# Patient Record
Sex: Female | Born: 2000 | Race: Black or African American | Hispanic: No | Marital: Single | Smoking: Never smoker
Health system: Southern US, Community
[De-identification: ages and names within clinical notes are randomized; demographics above are authoritative.]

## PROBLEM LIST (undated history)

## (undated) DIAGNOSIS — F419 Anxiety disorder, unspecified: Secondary | ICD-10-CM

## (undated) DIAGNOSIS — T7840XA Allergy, unspecified, initial encounter: Secondary | ICD-10-CM

## (undated) DIAGNOSIS — F32A Depression, unspecified: Secondary | ICD-10-CM

## (undated) DIAGNOSIS — F1910 Other psychoactive substance abuse, uncomplicated: Secondary | ICD-10-CM

## (undated) HISTORY — DX: Depression, unspecified: F32.A

## (undated) HISTORY — DX: Other psychoactive substance abuse, uncomplicated: F19.10

## (undated) HISTORY — DX: Allergy, unspecified, initial encounter: T78.40XA

## (undated) HISTORY — DX: Anxiety disorder, unspecified: F41.9

---
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
## 2019-06-19 ENCOUNTER — Other Ambulatory Visit: Payer: Self-pay

## 2019-06-23 ENCOUNTER — Ambulatory Visit: Payer: Medicaid Other | Attending: Internal Medicine

## 2019-06-23 DIAGNOSIS — Z20822 Contact with and (suspected) exposure to covid-19: Secondary | ICD-10-CM

## 2019-06-24 LAB — NOVEL CORONAVIRUS, NAA: SARS-CoV-2, NAA: NOT DETECTED

## 2019-09-13 ENCOUNTER — Other Ambulatory Visit: Payer: Medicaid Other

## 2020-06-05 ENCOUNTER — Other Ambulatory Visit: Payer: Self-pay

## 2023-06-21 ENCOUNTER — Other Ambulatory Visit (HOSPITAL_COMMUNITY): Payer: Self-pay

## 2023-06-21 MED ORDER — NORETHINDRONE 0.35 MG PO TABS
1.0000 | ORAL_TABLET | Freq: Every day | ORAL | 84 refills | Status: DC
  Filled 2023-06-21: qty 28, 28d supply, fill #0
  Filled 2023-07-14: qty 28, 28d supply, fill #1

## 2023-07-28 ENCOUNTER — Other Ambulatory Visit (HOSPITAL_COMMUNITY): Payer: Self-pay

## 2023-07-28 MED ORDER — AMOXICILLIN-POT CLAVULANATE 875-125 MG PO TABS
875.0000 mg | ORAL_TABLET | Freq: Two times a day (BID) | ORAL | 0 refills | Status: DC
  Filled 2023-07-28: qty 14, 7d supply, fill #0

## 2023-07-28 MED ORDER — FLUTICASONE PROPIONATE 50 MCG/ACT NA SUSP
2.0000 | Freq: Two times a day (BID) | NASAL | 0 refills | Status: DC
  Filled 2023-07-28: qty 16, 15d supply, fill #0

## 2023-07-28 MED ORDER — CLARITIN-D 12 HOUR 5-120 MG PO TB12
1.0000 | ORAL_TABLET | Freq: Two times a day (BID) | ORAL | 0 refills | Status: DC
  Filled 2023-07-28: qty 10, 5d supply, fill #0

## 2023-12-13 ENCOUNTER — Other Ambulatory Visit (HOSPITAL_COMMUNITY): Payer: Self-pay

## 2023-12-13 MED ORDER — NORETHIN ACE-ETH ESTRAD-FE 1-20 MG-MCG PO TABS
1.0000 | ORAL_TABLET | Freq: Every day | ORAL | 3 refills | Status: AC
  Filled 2023-12-13: qty 28, 28d supply, fill #0
  Filled 2024-01-06: qty 28, 28d supply, fill #1
  Filled 2024-01-31: qty 28, 28d supply, fill #2
  Filled 2024-03-02: qty 28, 28d supply, fill #3
  Filled 2024-03-22 (×2): qty 28, 28d supply, fill #4
  Filled 2024-04-19: qty 28, 28d supply, fill #5
  Filled 2024-05-17: qty 28, 28d supply, fill #6
  Filled 2024-06-10: qty 28, 28d supply, fill #7
  Filled 2024-06-27 – 2024-07-01 (×2): qty 28, 28d supply, fill #8

## 2024-01-06 ENCOUNTER — Other Ambulatory Visit (HOSPITAL_COMMUNITY): Payer: Self-pay

## 2024-02-24 ENCOUNTER — Encounter (HOSPITAL_BASED_OUTPATIENT_CLINIC_OR_DEPARTMENT_OTHER): Payer: Self-pay | Admitting: Emergency Medicine

## 2024-02-24 ENCOUNTER — Emergency Department (HOSPITAL_BASED_OUTPATIENT_CLINIC_OR_DEPARTMENT_OTHER)
Admission: EM | Admit: 2024-02-24 | Discharge: 2024-02-24 | Disposition: A | Discharge status: Home / Self Care | Source: Ambulatory Visit | Attending: Emergency Medicine | Admitting: Emergency Medicine

## 2024-02-24 ENCOUNTER — Emergency Department (HOSPITAL_BASED_OUTPATIENT_CLINIC_OR_DEPARTMENT_OTHER): Admitting: Radiology

## 2024-02-24 ENCOUNTER — Other Ambulatory Visit: Payer: Self-pay

## 2024-02-24 DIAGNOSIS — R1011 Right upper quadrant pain: Secondary | ICD-10-CM | POA: Insufficient documentation

## 2024-02-24 DIAGNOSIS — R1013 Epigastric pain: Secondary | ICD-10-CM | POA: Diagnosis not present

## 2024-02-24 DIAGNOSIS — F1290 Cannabis use, unspecified, uncomplicated: Secondary | ICD-10-CM | POA: Diagnosis not present

## 2024-02-24 DIAGNOSIS — R112 Nausea with vomiting, unspecified: Secondary | ICD-10-CM | POA: Diagnosis present

## 2024-02-24 DIAGNOSIS — R1012 Left upper quadrant pain: Secondary | ICD-10-CM | POA: Insufficient documentation

## 2024-02-24 DIAGNOSIS — R1010 Upper abdominal pain, unspecified: Secondary | ICD-10-CM

## 2024-02-24 LAB — COMPREHENSIVE METABOLIC PANEL WITH GFR
ALT: 14 U/L (ref 0–44)
AST: 25 U/L (ref 15–41)
Albumin: 4.3 g/dL (ref 3.5–5.0)
Alkaline Phosphatase: 61 U/L (ref 38–126)
Anion gap: 16 — ABNORMAL HIGH (ref 5–15)
BUN: 6 mg/dL (ref 6–20)
CO2: 18 mmol/L — ABNORMAL LOW (ref 22–32)
Calcium: 9.4 mg/dL (ref 8.9–10.3)
Chloride: 105 mmol/L (ref 98–111)
Creatinine, Ser: 0.76 mg/dL (ref 0.44–1.00)
GFR, Estimated: >60 mL/min (ref >60)
Glucose, Bld: 114 mg/dL — ABNORMAL HIGH (ref 70–99)
Potassium: 4.5 mmol/L (ref 3.5–5.1)
Sodium: 138 mmol/L (ref 135–145)
Total Bilirubin: 0.4 mg/dL (ref 0.0–1.2)
Total Protein: 8.2 g/dL — ABNORMAL HIGH (ref 6.5–8.1)

## 2024-02-24 LAB — URINE DRUG SCREEN
Amphetamines: NEGATIVE
Barbiturates: NEGATIVE
Benzodiazepines: NEGATIVE
Cocaine: NEGATIVE
Fentanyl: NEGATIVE
Methadone Scn, Ur: NEGATIVE
Opiates: NEGATIVE
Tetrahydrocannabinol: POSITIVE — AB

## 2024-02-24 LAB — URINALYSIS, ROUTINE W REFLEX MICROSCOPIC
Bilirubin Urine: NEGATIVE
Glucose, UA: NEGATIVE mg/dL
Hgb urine dipstick: NEGATIVE
Ketones, ur: >80 mg/dL — AB
Leukocytes,Ua: NEGATIVE
Nitrite: NEGATIVE
Protein, ur: NEGATIVE mg/dL
Specific Gravity, Urine: 1.02 (ref 1.005–1.030)
pH: 6.5 (ref 5.0–8.0)

## 2024-02-24 LAB — CBC
HCT: 38.5 % (ref 36.0–46.0)
Hemoglobin: 12 g/dL (ref 12.0–15.0)
MCH: 24 pg — ABNORMAL LOW (ref 26.0–34.0)
MCHC: 31.2 g/dL (ref 30.0–36.0)
MCV: 77.2 fL — ABNORMAL LOW (ref 80.0–100.0)
Platelets: 341 K/uL (ref 150–400)
RBC: 4.99 MIL/uL (ref 3.87–5.11)
RDW: 14.7 % (ref 11.5–15.5)
WBC: 13.6 K/uL — ABNORMAL HIGH (ref 4.0–10.5)
nRBC: 0 % (ref 0.0–0.2)

## 2024-02-24 LAB — RESP PANEL BY RT-PCR (RSV, FLU A&B, COVID)  RVPGX2
Influenza A by PCR: NEGATIVE
Influenza B by PCR: NEGATIVE
Resp Syncytial Virus by PCR: NEGATIVE
SARS Coronavirus 2 by RT PCR: NEGATIVE

## 2024-02-24 LAB — PREGNANCY, URINE: Preg Test, Ur: NEGATIVE

## 2024-02-24 LAB — LIPASE, BLOOD: Lipase: 16 U/L (ref 11–51)

## 2024-02-24 MED ORDER — SODIUM CHLORIDE 0.9 % IV SOLN
12.5000 mg | Freq: Once | INTRAVENOUS | Status: AC
  Administered 2024-02-24: 12.5 mg via INTRAVENOUS
  Filled 2024-02-24: qty 0.5

## 2024-02-24 MED ORDER — PROMETHAZINE HCL 25 MG/ML IJ SOLN
INTRAMUSCULAR | Status: AC
  Filled 2024-02-24: qty 1

## 2024-02-24 MED ORDER — PROMETHAZINE HCL 25 MG RE SUPP: 25.0000 mg | Freq: Four times a day (QID) | RECTAL | 0 refills | Status: AC | PRN

## 2024-02-24 MED ORDER — SODIUM CHLORIDE 0.9 % IV BOLUS
1000.0000 mL | Freq: Once | INTRAVENOUS | Status: AC
  Administered 2024-02-24: 1000 mL via INTRAVENOUS

## 2024-02-24 NOTE — ED Triage Notes (Signed)
 Lab work drawn at [facility]

## 2024-02-24 NOTE — Discharge Instructions (Signed)
 Thank you for letting us  evaluate you today.  Your lab work was unremarkable.  We have given you an additional dose of IV fluids, Zofran, Phenergan  for hydration, nausea, vomiting.  Symptoms are likely secondary to stomach virus or THC use.  Please continue being hydrated with water, Gatorade, Pedialyte, chicken broth.  If you do not eat that is okay as long as you are able to drink fluids.  You may try to eat bland foods such as toast, saltines as you start to introduce food back into your diet.  I would also recommend room temperature liquids as cool liquids tend to shock belly and may cause nausea, vomiting.  Have also sent Phenergan  suppositories to use if you are actively vomiting and needs something to stop.  I would also recommend to decrease marijuana use.  I have also provided you with primary care provider to establish care for routine medical complaints, annual visits  Return to Emergency Department if you are unable to keep liquids down, severe debilitating abdominal pain especially if localized in 1 specific area, worsening symptoms

## 2024-02-24 NOTE — ED Provider Notes (Cosign Needed Addendum)
 [place] EMERGENCY DEPARTMENT AT [facility] Provider Note   CSN: [id number] Arrival date & time: 02/24/24  1224     Patient presents with: Emesis   Madeline George is a 23 y.o. female with no noted past medical history presents to Emergency Department for evaluation of N/V, chills that started yesterday.  Yesterday had no episodes of vomiting but was intermittently nauseous all day.  Vomiting started this morning at 0430 with generalized abdominal pain.  No diarrhea.  Last BM was yesterday morning and normal.  Abdominal pain is described as 5/10 and cramping in nature.  Pain worsens prior to vomiting. Last week endorses nonproductive cough, nasal congestion but reports improvement of symptoms.  Denies recent travel, recent antibiotics, urinary symptoms, suspicious foods, fevers  Was originally evaluated by urgent care who provided Zofran, 1 L IVF and sent to emergency room as she was not improving of symptoms and continued to feel nauseous.  Of note, uses marijuana socially on weekends.  Typically uses 2-3 times a week for past several years.   Emesis      Prior to Admission medications   Medication Sig Start Date End Date Taking? Authorizing Provider  promethazine  (PHENERGAN ) 25 MG suppository Place 1 suppository (25 mg total) rectally every 6 (six) hours as needed for nausea or vomiting. 02/24/24  Yes Minnie Tinnie BRAVO, PA  amoxicillin -clavulanate (AUGMENTIN ) 875-125 MG tablet Take 1 tablet by mouth 2 (two) times daily for 7 days. 07/28/23     fluticasone  (FLONASE ) 50 MCG/ACT nasal spray Place 2 sprays into both nostrils 2 (two) times daily. 07/28/23     ibuprofen  (ADVIL ,MOTRIN ) 600 MG tablet Take 1 tablet (600 mg total) by mouth every 6 (six) hours as needed for mild pain. 04/06/14   Rhae Lye, MD  loratadine -pseudoephedrine  (CLARITIN -D 12 HOUR) 5-120 MG tablet Take 1 tablet by mouth every 12 (twelve) hours for 5 days. 07/28/23     norethindrone  (MICRONOR ) 0.35 MG tablet  Take 1 tablet by mouth at the same time every day. 06/19/23     norethindrone -ethinyl estradiol -FE (JUNEL  FE 1/20) 1-20 MG-MCG tablet Take 1 tablet by mouth daily. 12/13/23       Allergies: Patient has no known allergies.    Review of Systems  Gastrointestinal:  Positive for vomiting.    Updated Vital Signs BP 103/80   Pulse 80   Temp 98 F (36.7 C) (Oral)   Resp (!) 21   Ht 5' 8 (1.727 m)   Wt 79.5 kg   LMP 01/31/2024 (Approximate)   SpO2 100%   BMI 26.65 kg/m   Physical Exam Vitals and nursing note reviewed.  Constitutional:      General: She is not in acute distress.    Appearance: Normal appearance.  HENT:     Head: Normocephalic and atraumatic.  Eyes:     Conjunctiva/sclera: Conjunctivae normal.  Cardiovascular:     Rate and Rhythm: Normal rate.  Pulmonary:     Effort: Pulmonary effort is normal. No respiratory distress.     Breath sounds: Normal breath sounds.  Abdominal:     General: Bowel sounds are normal. There is no distension.     Palpations: Abdomen is soft.     Tenderness: There is abdominal tenderness in the right upper quadrant, epigastric area and left upper quadrant. There is no right CVA tenderness, left CVA tenderness, guarding or rebound.     Comments: Nonsurgical abdomen with no peritoneal signs. Very mild abd tenderness in  RUQ, LUQ, and epigastric  regions  Musculoskeletal:     Right lower leg: No edema.     Left lower leg: No edema.  Skin:    Coloration: Skin is not jaundiced or pale.  Neurological:     Mental Status: She is alert and oriented to person, place, and time. Mental status is at baseline.     (all labs ordered are listed, but only abnormal results are displayed) Labs Reviewed  COMPREHENSIVE METABOLIC PANEL WITH GFR - Abnormal; Notable for the following components:      Result Value   CO2 18 (*)    Glucose, Bld 114 (*)    Total Protein 8.2 (*)    Anion gap 16 (*)    All other components within normal limits  CBC - Abnormal;  Notable for the following components:   WBC 13.6 (*)    MCV 77.2 (*)    MCH 24.0 (*)    All other components within normal limits  URINALYSIS, ROUTINE W REFLEX MICROSCOPIC - Abnormal; Notable for the following components:   Ketones, ur >80 (*)    All other components within normal limits  URINE DRUG SCREEN - Abnormal; Notable for the following components:   Tetrahydrocannabinol POSITIVE (*)    All other components within normal limits  RESP PANEL BY RT-PCR (RSV, FLU A&B, COVID)  RVPGX2  LIPASE, BLOOD  PREGNANCY, URINE    EKG: EKG Interpretation Date/Time:  Thursday February 24 2024 16:07:22 EDT Ventricular Rate:  70 PR Interval:  119 QRS Duration:  80 QT Interval:  423 QTC Calculation: 457 R Axis:   14  Text Interpretation: Sinus rhythm Borderline short PR interval Confirmed by Gennaro Bouchard ([id number]) on 02/24/2024 4:11:01 PM  Radiology: [facility] Chest 2 View Result Date: 02/24/2024 CLINICAL DATA:  r/o pna EXAM: CHEST - 2 VIEW COMPARISON:  June 25, 2015 FINDINGS: No focal airspace consolidation, pleural effusion, or pneumothorax. No cardiomegaly.No acute fracture or destructive lesion. IMPRESSION: No acute cardiopulmonary abnormality. Electronically Signed   By: Rogelia Myers M.D.   On: 02/24/2024 16:54    Medications Ordered in the ED  promethazine  (PHENERGAN ) 25 MG/ML injection (  Total Dose 02/24/24 1705)  sodium chloride  0.9 % bolus 1,000 mL (0 mLs Intravenous Stopped 02/24/24 1630)  promethazine  (PHENERGAN ) 12.5 mg in sodium chloride  0.9 % 50 mL IVPB (0 mg Intravenous Stopped 02/24/24 1730)    Clinical Course as of 02/24/24 1850  Thu Feb 24, 2024  1616 Ketones, ur(!): >80 Likely 2/2 multiple episodes of vomiting, dehydration, poor PO intake. No hx of diabetes nor SGLT use. [LB]  1847 Tetrahydrocannabinol(!): POSITIVE [LB]  1847 Anion gap(!): 16 Likely secondary to starvation ketoacidosis, dehydration.  Has had 2 L IVF which will decrease this gap.  Denies recent alcohol,  aspirin, iron, cyanide use.  No exposure to carbon monoxide.  No history of diabetes and doubt euglycemic DKA. No indications for lactic acidosis, sepsis, AKI, nor hypoxia. [LB]    Clinical Course User Index [LB] Minnie Tinnie BRAVO, PA                                 Medical Decision Making Amount and/or Complexity of Data Reviewed Labs: ordered. Decision-making details documented in ED Course. Radiology: ordered.  Risk Prescription drug management.   Patient presents to the ED for concern of abd pain, NV, cough, congestion, this involves an extensive number of treatment options, and is a complaint that carries with it a  high risk of complications and morbidity.  The differential diagnosis includes pneumonia, gastroenteritis, pancreatitis, cyclical vomiting, appendicitis, bowel obstruction, perforation, GB pathology, pregnancy, food poisoning. Not an exhaustive list   Co morbidities that complicate the patient evaluation  None   Additional history obtained:  Additional history obtained from Nursing   External records from outside source obtained and reviewed including triage RN note   Lab Tests:  I Ordered, and personally interpreted labs.  The pertinent results include:   Mild leukocytosis of 13.6 Anion gap 16 which is likely secondary to starvation ketoacidosis, dehydration   Imaging Studies ordered:  I ordered imaging studies including CXR  I independently visualized and interpreted imaging which showed no cardiopulmonary pathology I agree with the radiologist interpretation   Cardiac Monitoring:  The patient was maintained on a cardiac monitor.  I personally viewed and interpreted the cardiac monitored which showed an underlying rhythm of: NSR at 78 bpm with no ST, T wave abnormalities, nor prolonged QT   Medicines ordered and prescription drug management:  I ordered medication including IVF, phenergan   for hydration, NV  Reevaluation of the patient after these  medicines showed that the patient improved I have reviewed the patients home medicines and have made adjustments as needed    Problem List / ED Course:  NV Vital signs WNL with no tachycardia nor fever Recently improving from what sounds to be a viral URI.  She reports nonproductive cough, nasal congestion.  Lung sounds CTAB.  Maintaining oxygen saturation without supplementation.  Respiratory panel negative.  No complaints of chest pain or shortness of breath.  Chest x-ray negative for pneumonia Does have mild abdominal tenderness in RUQ, epigastrium, LUQ regions.  Nonsurgical abdomen with no peritoneal signs.  Low suspicion for emergent acute abdominal pathology for symptoms Although she does have vomiting, no LLQ nor RLQ tenderness nor complaints of pain.  Low suspicion for appendicitis, ovarian torsion as etiology of vomiting Low suspicion for GB or liver pathology with no transaminitis nor elevated bili  Labs notable for ketones in urine, AG 16, and mild leukocytosis of 13. UA wo infection, hgb Was provided 1 L IVF, Zofran, phenergan  this morning from urgent care.  Will provide an additional liter of fluid for mildly elevated anion gap, hydration as she has had multiple episodes of vomiting Checked EKG with no QT prolongation Symptoms are likely secondary to gastroenteritis vs cyclical vomiting with no significant lab work derangement nor focal tenderness nor surgical abdomen.  Do not think that CT abdomen imaging is required at this time Provided an additional 1 L NS, Phenergan  for nausea resolving abd pain, NV Passes PO challenge UDS positive for THC and may be contributing to symptoms.  Discussed THC cessation Was provided Zofran 8 mg tablets by urgent care to pharmacy.  I also provided Phenergan  suppositories for nausea, vomiting Discussed all with patient.  She expresses understanding and is stable for discharged.   Reevaluation:  After the interventions noted above, I  reevaluated the patient and found that they have :improved    Dispostion:  After consideration of the diagnostic results and the patients response to treatment, I feel that the patent would benefit from outpatient with symptomatic treatment.   Discussed ED workup, disposition, return to ED precautions with patient who expresses understanding agrees with plan.  All questions answered to their satisfaction.  They are agreeable to plan.  Discharge instructions provided on paperwork  Final diagnoses:  Nausea and vomiting, unspecified vomiting type  Pain of upper  abdomen  Marijuana use    ED Discharge Orders          Ordered    promethazine  (PHENERGAN ) 25 MG suppository  Every 6 hours PRN        02/24/24 1838             Minnie Tinnie BRAVO, PA 02/24/24 1850    Minnie Tinnie BRAVO, PA 02/24/24 1851    Rogelia Jerilynn RAMAN, MD 02/25/24 1428

## 2024-02-24 NOTE — ED Triage Notes (Signed)
 Pt via pov from home with emesis since 0430 today. Pt reports that she was given zofran and phenergan  and a fluid bolus at [facility]. They sent her here for further evaluation because she was not better. Pt a&o x 4; nad noted.

## 2024-03-02 ENCOUNTER — Ambulatory Visit: Payer: Self-pay

## 2024-03-02 NOTE — Telephone Encounter (Signed)
 Duplicate, error  Copied from CRM [id number]. Topic: Clinical - Pink Word Triage >> Mar 02, 2024  9:40 AM Joesph NOVAK wrote: Reason for Triage: Patient has been vomiting since last Thursday. She can't keep food down and is losing weight. She went to the ER and was told to follow up with PCP. >> Mar 02, 2024  9:42 AM Joesph B wrote: Vomiting since last Thursday, can't keep food down. Patient says she is losing weight. Was told to follow up with her PCP.This encounter was created in error - please disregard.

## 2024-03-02 NOTE — Telephone Encounter (Signed)
 FYI Only or Action Required?: FYI only for provider.  Patient was last seen in primary care on na.  Called Nurse Triage reporting Appointment.  Symptoms began several days ago.  Interventions attempted: Nothing.  Symptoms are: stable.  Triage Disposition: No disposition on file.  Patient/caregiver understands and will follow disposition?:     Vomiting since last Thursday, can't keep food down. Patient says she is losing weight. Was told to follow up with her PCP.

## 2024-03-03 ENCOUNTER — Encounter: Payer: Self-pay | Admitting: Nurse Practitioner

## 2024-03-03 ENCOUNTER — Ambulatory Visit: Admitting: Nurse Practitioner

## 2024-03-03 VITALS — BP 121/57 | HR 72 | Temp 96.8°F | Wt 156.0 lb

## 2024-03-03 DIAGNOSIS — Z1329 Encounter for screening for other suspected endocrine disorder: Secondary | ICD-10-CM

## 2024-03-03 DIAGNOSIS — F339 Major depressive disorder, recurrent, unspecified: Secondary | ICD-10-CM | POA: Insufficient documentation

## 2024-03-03 DIAGNOSIS — F1290 Cannabis use, unspecified, uncomplicated: Secondary | ICD-10-CM | POA: Diagnosis not present

## 2024-03-03 DIAGNOSIS — Z0182 Encounter for allergy testing: Secondary | ICD-10-CM | POA: Diagnosis not present

## 2024-03-03 DIAGNOSIS — F32A Depression, unspecified: Secondary | ICD-10-CM

## 2024-03-03 DIAGNOSIS — F419 Anxiety disorder, unspecified: Secondary | ICD-10-CM | POA: Diagnosis not present

## 2024-03-03 DIAGNOSIS — R112 Nausea with vomiting, unspecified: Secondary | ICD-10-CM

## 2024-03-03 MED ORDER — FLUOXETINE HCL 20 MG PO CAPS: 20.0000 mg | ORAL_CAPSULE | Freq: Every day | ORAL | 2 refills | Status: DC

## 2024-03-03 NOTE — Assessment & Plan Note (Signed)
 Improving, Tolerating liquids and light foods, heavier foods still cause vomiting. Minimal abdominal pain. Previously treated with IV fluids and Phenergan . - Continue soft diet as tolerated. - Maintain hydration with at least 64 ounces of water daily. - Use zofran  as needed for nausea. She has been using marijuana, encouraged to continue to abstain from marijuana use

## 2024-03-03 NOTE — Patient Instructions (Addendum)
 1. Anxiety and depression (Primary)  - FLUoxetine  (PROZAC ) 20 MG capsule; Take 1 capsule (20 mg total) by mouth daily.  Dispense: 60 capsule; Refill: 2 - AMB Referral [facility]BCI Care Management        Behavioral Health Resources:    What if I or someone I know is in crisis?   If you are thinking about harming yourself or having thoughts of suicide, or if you know someone who is, seek help right away.   Call your doctor or mental health care provider.   Call 911 or go to a hospital emergency room to get immediate help, or ask a friend or family member to help you do these things; IF YOU ARE IN [place], YOU MAY GO TO WALK-IN URGENT CARE 24/7 at [facility] (see below)   Call the [place]  National Suicide Prevention Lifeline's toll-free, 24-hour hotline at 1-800-273-TALK 417-109-5776) or TTY: 1-800-799-4 TTY 432-206-2735) to talk to a trained counselor.   If you are in crisis, make sure you are not left alone.    If someone else is in crisis, make sure he or she is not left alone     24 Hour :    [facility]  [address] [place], [place] [address] [phone] or [phone] WALK-IN URGENT CARE 24/7   [facility] Mobile Crisis: [phone]   [place]  National Suicide Hotline: 507 334 5250   [facility] the [employer] (Domestic Violence, Rape & Victim Assistance)  (641)012-8801   [facility]  [address], [place]  [address]   [phone] or [phone]    [facility] [employer] Crisis Services: [phone] (8am-4pm) or [phone] (after hours)          [facility], [address] [place], [place]  [phone] Fax: [phone] [email or web address] *Interpreters available *Accepts all insurance and uninsured for Urgent Care needs *Accepts Medicaid and uninsured for outpatient treatment    [facility]   Mon-Fri: 8am-5pm [address], [place], [place] [phone](eynwz); [phone](fax) [email or web address]  *Accepts Medicare   [facility] Pablo Earlean [place], Fri: 8am-4pm [address], [place], [place] [address] [phone] (phone); [phone] (fax) [email or web address]  *Accepts Medicare   [facility] Mon-Fri: 9am-5pm  [address], [place], [place] [phone] (phone); [phone]  [email or web address]  *Accepts Medicaid   [facility] [place], 8:30am-12pm/1pm-2:30pm [address] [place], [place] [phone] (phone); [phone] (fax) [email or web address].[email or web address]  *Accepts Medicaid, sliding-scale*Bilingual services available   [facility] Mon-Fri, 8am-7pm [address] [place], [place]  [phone](eynwz); [phone](fax) [email or web address].[email or web address]  *Accepts Medicaid *Bilingual services available   [facility] Mon-Fri: 8am-5pm, Saturday by appointment only [address] [place], [place], [place] [phone] (phone); [phone] (fax) [email or web address]    [facility] [address], [place], [place] [phone] [email or web address]  *Free & reduced services for uninsured and underinsured individuals *Bilingual services for Spanish-speaking clients 21 and under   [facility], [address] [place], [place] [phone](eynwz); [phone](fax) [email or web address]  *Bring your own interpreter at first visit *Accepts Medicare and [facility]   [facility] Mon-Fri: 9am-5:30pm [address], [place], [place] [phone] (phone), [phone] (fax) After hours crisis line: [phone] [email or web address]  *Accepts Medicare and Medicaid   [employer], 8am-6pm [address], [place], [place]  [phone] (phone); [phone] (fax) [email or web address]  *Subsidized costs available  [facility] Mon-Fri: 8am-4pm [address], [place], [place] [phone](eynwz); [phone](fax) [email or web address]  *Accepts Medicaid   [facility] Same day access hours: Mon-Fri, 8:30-3pm Crisis hours: Mon-Fri, 8am-5pm [address] [place], [place] [phone]   [facility] [employer] Same day access hours: Mon-Fri, 8:30-3pm Crisis hours: Mon-Fri, 8am-8pm [address] [place], [place] [phone] (phone); [phone] (fax) [email or web address].[email or web address]  *Accepts Medicaid and Medicare   [facility] [place], [place], Fri: 9am-9pm Tues, Thurs: 9am-6pm [address] [place], [place], [place]  [phone] (phone); [phone] (fax) [email or web address]  *(Accepts Medicare and Medicaid; payment plans available)*Bilingual services available   [facility] [address], [place], [place] [phone] (phone); [phone] (fax) [email or web address]    [facility] [address], [place], [place]  [phone]  [email or web address]  *Bilingual services available   [facility]) Mon-Thurs: 8am-8pm [address], [place], [place] [phone] (phone); [phone] (fax) [email or web address]  *Accepts Medicaid*Bilingual services available   [facility] [address] [place], [place] [phone] (phone) [email or web address]  *Accepts Medicaid *Bilingual services available   [facility] Mon-Fri, 9am-4:45pm [address], [place], [place] [phone] (phone); [phone] (fax) [email or web address]  *Accepts Medicare   [facility] Mon-Thurs: 8:30-8pm, Fri:  8:30am-7pm [address], [place], [place] (3rd floor) [phone] (phone); [phone] (fax) [email or web address]  *Accepts Medicaid; income-based reduced rates available   [facility] Mon-Fri: 8am-5pm [address], [place], [place] [address] [phone] (phone); [phone] (fax) [email or web address]  *Accepts Medicaid*Bilingual services available     [facility] [place])  [address], [place] [phone] www.[email or web address]  *Provides direct services to individuals in recovery from mental illness, including support groups, recovery skills classes, and one on one peer support   [employer] on Mental Illness) [place] helpline: (636) [phone]  [facility] [place] helpline: 814-119-5111 https://[email or web address]  *A community hub for information relating to local resources and services for the friends and families of individuals living alongside a mental health condition, as well as the individuals themselves. Classes and support groups also provided   It is important that you exercise regularly at least 30 minutes 5 times a week as tolerated  Think about what you will eat, plan ahead. Choose  clean, green, fresh or frozen over canned, processed or packaged foods which are more sugary, salty and fatty. 70 to 75% of food eaten should be vegetables and fruit. Three meals at set times with snacks allowed between meals, but they must be fruit or vegetables. Aim to eat over a 12 hour period , example 7 am to 7 pm, and STOP after  your last meal of the day. Drink water,generally about 64 ounces per day, no other drink is as healthy. Fruit juice is best enjoyed in a healthy way, by EATING the fruit.  Thanks for choosing [facility] we consider it a privelige to serve you.

## 2024-03-03 NOTE — Assessment & Plan Note (Signed)
 Recent lip swelling suspected allergic reaction, resolved with Benadryl and injection. Referral to allergy testing planned. - Refer to allergy testing to identify potential allergens.

## 2024-03-03 NOTE — Assessment & Plan Note (Addendum)
 Flowsheet Row Office Visit from 03/03/2024 in [facility]  PHQ-9 Total Score 7       03/03/2024    1:40 PM  GAD 7 : Generalized Anxiety Score  Nervous, Anxious, on Edge 2  Control/stop worrying 0  Worry too much - different things 2  Trouble relaxing 1  Restless 2  Easily annoyed or irritable 3  Afraid - awful might happen 2  Total GAD 7 Score 12  Anxiety Difficulty Somewhat difficult   Major depressive disorder and anxiety disorder Long-standing major depressive disorder and anxiety disorder with fluctuating symptoms. Open to starting medication and counseling. Prozac  20 mg daily recommended. Discussed potential side effects, including nausea. Advised to seek immediate help if experiencing suicidal thoughts. - Start Prozac  20 mg daily. - Provide list of local therapists for counseling. - Advise on potential side effects of Prozac , including nausea. - Instruct to seek immediate help if experiencing suicidal thoughts. Checking today sick to screen for thyroid disorder

## 2024-03-03 NOTE — Progress Notes (Signed)
 New Patient Office Visit  Subjective:  Patient ID: Madeline George, female    DOB: Mar 29, 2001  Age: 23 y.o. MRN: [id number]  CC:  Chief Complaint  Patient presents with   Establish Care    HPI   Discussed the use of AI scribe software for clinical note transcription with the patient, who gave verbal consent to proceed.  History of Present Illness Madeline George is a 23 year old female  has a past medical history of Allergy, Anxiety, Depression, and Substance abuse (HCC). who presents to establish care    She has been experiencing stomach upset since September 18th, characterized by severe nausea and vomiting. She was unable to eat full meals and could only tolerate liquids such as Pedialyte, water, and chicken broth. Solid foods like rice caused vomiting. She visited the emergency room, where she received IV fluids and was given Phenergan . Symptoms have gradually improved, with nausea and vomiting decreasing, and she is now able to keep down some liquids and small amounts of fruit. Abdominal pain is now minimal.  Earlier this month, she experienced severe lip swelling, which required urgent care and a Benadryl shot. She suspects an allergic reaction but is unsure of the trigger.  Her family history includes a father who had diabetes and a stroke, and two sisters who were previously prediabetic but have since improved. She lives with her family, including her sisters and mother.  Socially, she has a history of marijuana use, which she believes contributed to her stomach issues, but she has since stopped. She drinks alcohol socially but has abstained recently due to her stomach problems. She does not smoke cigarettes.  She has been dealing with anxiety and depression since her teenage years, with symptoms fluctuating seasonally and due to family stressors, including her father's passing. She has not had thoughts of self-harm and is open to counseling and medication now that she has insurance.     Assessment & Plan     Past Medical History:  Diagnosis Date   Allergy    Anxiety    Depression    Substance abuse (HCC)     History reviewed. No pertinent surgical history.  Family History  Problem Relation Age of Onset   Diabetes Father    Stroke Father     Social History   Socioeconomic History   Marital status: Single    Spouse name: Not on file   Number of children: Not on file   Years of education: Not on file   Highest education level: Bachelor's degree (e.g., BA, AB, BS)  Occupational History   Not on file  Tobacco Use   Smoking status: Never   Smokeless tobacco: Never  Vaping Use   Vaping status: Never Used  Substance and Sexual Activity   Alcohol use: Yes    Alcohol/week: 4.0 standard drinks of alcohol    Types: 2 Glasses of wine, 2 Shots of liquor per week    Comment: I typically drink socially with friends or if I am going out   Drug use: Not Currently    Types: Marijuana    Comment: socially   Sexual activity: Not Currently    Birth control/protection: Condom, Pill  Other Topics Concern   Not on file  Social History Narrative   Lives with her sisters and mother    Social Drivers of Health   Financial Resource Strain: Medium Risk (03/02/2024)   Overall Financial Resource Strain (CARDIA)    Difficulty of Paying Living  Expenses: Somewhat hard  Food Insecurity: Food Insecurity Present (03/02/2024)   Hunger Vital Sign    Worried About Running Out of Food in the Last Year: Sometimes true    Ran Out of Food in the Last Year: Never true  Transportation Needs: No Transportation Needs (03/02/2024)   PRAPARE - Administrator, Civil Service (Medical): No    Lack of Transportation (Non-Medical): No  Physical Activity: Sufficiently Active (03/02/2024)   Exercise Vital Sign    Days of Exercise per Week: 5 days    Minutes of Exercise per Session: 30 min  Stress: Stress Concern Present (03/02/2024)   [employer] of Occupational Health -  Occupational Stress Questionnaire    Feeling of Stress: Rather much  Social Connections: Socially Isolated (03/02/2024)   Social Connection and Isolation Panel    Frequency of Communication with Friends and Family: Never    Frequency of Social Gatherings with Friends and Family: Twice a week    Attends Religious Services: More than 4 times per year    Active Member of [employer] or Organizations: No    Attends Engineer, structural: Not on file    Marital Status: Never married  Intimate Partner Violence: Unknown (09/12/2021)   Received from [facility]   HITS    Physically Hurt: Not on file    Insult or Talk Down To: Not on file    Threaten Physical Harm: Not on file    Scream or Curse: Not on file    ROS Review of Systems  Constitutional:  Negative for appetite change, chills, fatigue and fever.  HENT:  Negative for congestion, postnasal drip, rhinorrhea and sneezing.   Respiratory:  Negative for cough, shortness of breath and wheezing.   Cardiovascular:  Negative for chest pain, palpitations and leg swelling.  Gastrointestinal:  Positive for nausea and vomiting. Negative for abdominal pain and constipation.  Genitourinary:  Negative for difficulty urinating, dysuria, flank pain and frequency.  Musculoskeletal:  Negative for arthralgias, back pain, joint swelling and myalgias.  Skin:  Negative for color change, pallor, rash and wound.  Neurological:  Negative for dizziness, facial asymmetry, weakness, numbness and headaches.  Psychiatric/Behavioral:  Negative for behavioral problems, confusion, self-injury and suicidal ideas.     Objective:   Today's Vitals: BP (!) 121/57   Pulse 72   Temp (!) 96.8 F (36 C)   Wt 156 lb (70.8 kg)   LMP 01/31/2024 (Approximate)   SpO2 100%   BMI 23.72 kg/m   Physical Exam Vitals and nursing note reviewed.  Constitutional:      General: She is not in acute distress.    Appearance: Normal appearance. She is not ill-appearing,  toxic-appearing or diaphoretic.  HENT:     Mouth/Throat:     Pharynx: No posterior oropharyngeal erythema.  Eyes:     General: No scleral icterus.       Right eye: No discharge.        Left eye: No discharge.     Extraocular Movements: Extraocular movements intact.     Conjunctiva/sclera: Conjunctivae normal.  Cardiovascular:     Rate and Rhythm: Normal rate and regular rhythm.     Pulses: Normal pulses.     Heart sounds: Normal heart sounds. No murmur heard.    No friction rub. No gallop.  Pulmonary:     Effort: Pulmonary effort is normal. No respiratory distress.     Breath sounds: Normal breath sounds. No stridor. No wheezing, rhonchi or rales.  Chest:     Chest wall: No tenderness.  Abdominal:     General: There is no distension.     Palpations: Abdomen is soft.     Tenderness: There is no abdominal tenderness. There is no right CVA tenderness, left CVA tenderness or guarding.  Musculoskeletal:        General: No swelling, tenderness, deformity or signs of injury.     Right lower leg: No edema.     Left lower leg: No edema.  Skin:    General: Skin is warm and dry.     Capillary Refill: Capillary refill takes less than 2 seconds.     Coloration: Skin is not jaundiced or pale.     Findings: No bruising, erythema or lesion.  Neurological:     Mental Status: She is alert and oriented to person, place, and time.     Motor: No weakness.     Gait: Gait normal.  Psychiatric:        Mood and Affect: Mood normal.        Behavior: Behavior normal.        Thought Content: Thought content normal.        Judgment: Judgment normal.     Assessment & Plan:   Problem List Items Addressed This Visit       Digestive   Nausea and vomiting   Improving, Tolerating liquids and light foods, heavier foods still cause vomiting. Minimal abdominal pain. Previously treated with IV fluids and Phenergan . - Continue soft diet as tolerated. - Maintain hydration with at least 64 ounces of  water daily. - Use zofran  as needed for nausea. She has been using marijuana, encouraged to continue to abstain from marijuana use         Other   Encounter for allergy testing   Recent lip swelling suspected allergic reaction, resolved with Benadryl and injection. Referral to allergy testing planned. - Refer to allergy testing to identify potential allergens.        Relevant Orders   Ambulatory referral to Allergy   Marijuana use    History of marijuana use, suspected to contribute to gastrointestinal symptoms. Ceased marijuana use. Currently abstaining from alcohol due to stomach issues. - Continue abstaining from marijuana use.       Anxiety and depression - Primary   Flowsheet Row Office Visit from 03/03/2024 in [facility]  PHQ-9 Total Score 7       03/03/2024    1:40 PM  GAD 7 : Generalized Anxiety Score  Nervous, Anxious, on Edge 2  Control/stop worrying 0  Worry too much - different things 2  Trouble relaxing 1  Restless 2  Easily annoyed or irritable 3  Afraid - awful might happen 2  Total GAD 7 Score 12  Anxiety Difficulty Somewhat difficult   Major depressive disorder and anxiety disorder Long-standing major depressive disorder and anxiety disorder with fluctuating symptoms. Open to starting medication and counseling. Prozac  20 mg daily recommended. Discussed potential side effects, including nausea. Advised to seek immediate help if experiencing suicidal thoughts. - Start Prozac  20 mg daily. - Provide list of local therapists for counseling. - Advise on potential side effects of Prozac , including nausea. - Instruct to seek immediate help if experiencing suicidal thoughts. Checking today sick to screen for thyroid disorder          Relevant Medications   FLUoxetine  (PROZAC ) 20 MG capsule  Other Relevant Orders   AMB Referral VBCI Care Management   TSH   Other Visit Diagnoses        Screening for thyroid disorder       Relevant Orders   TSH       Outpatient Encounter Medications as of 03/03/2024  Medication Sig   FLUoxetine  (PROZAC ) 20 MG capsule Take 1 capsule (20 mg total) by mouth daily.   L-LYSINE HCL PO Take 1 tablet every day by oral route.   norethindrone -ethinyl estradiol -FE (JUNEL  FE 1/20) 1-20 MG-MCG tablet Take 1 tablet by mouth daily.   ondansetron (ZOFRAN) 8 MG tablet Take 8 mg by mouth.   promethazine  (PHENERGAN ) 25 MG suppository Place 1 suppository (25 mg total) rectally every 6 (six) hours as needed for nausea or vomiting.   Multiple Vitamin (MULTIVITAMIN ADULT PO)    [DISCONTINUED] amoxicillin -clavulanate (AUGMENTIN ) 875-125 MG tablet Take 1 tablet by mouth 2 (two) times daily for 7 days.   [DISCONTINUED] fluticasone  (FLONASE ) 50 MCG/ACT nasal spray Place 2 sprays into both nostrils 2 (two) times daily.   [DISCONTINUED] ibuprofen  (ADVIL ,MOTRIN ) 600 MG tablet Take 1 tablet (600 mg total) by mouth every 6 (six) hours as needed for mild pain.   [DISCONTINUED] loratadine -pseudoephedrine  (CLARITIN -D 12 HOUR) 5-120 MG tablet Take 1 tablet by mouth every 12 (twelve) hours for 5 days.   [DISCONTINUED] norethindrone  (MICRONOR ) 0.35 MG tablet Take 1 tablet by mouth at the same time every day.   No facility-administered encounter medications on file as of 03/03/2024.    Follow-up: Return in about 6 weeks (around 04/14/2024) for CPE.   Davell Beckstead R Calynn Ferrero, FNP

## 2024-03-03 NOTE — Assessment & Plan Note (Signed)
  History of marijuana use, suspected to contribute to gastrointestinal symptoms. Ceased marijuana use. Currently abstaining from alcohol due to stomach issues. - Continue abstaining from marijuana use.

## 2024-03-04 LAB — TSH: TSH: 0.541 u[IU]/mL (ref 0.450–4.500)

## 2024-03-06 ENCOUNTER — Telehealth: Payer: Self-pay | Admitting: *Deleted

## 2024-03-06 ENCOUNTER — Ambulatory Visit: Payer: Self-pay | Admitting: Nurse Practitioner

## 2024-03-06 NOTE — Progress Notes (Unsigned)
 Complex Care Management Note Care Guide Note  03/06/2024 Name: Madeline George MRN: [id number] DOB: [birth year]   Complex Care Management Outreach Attempts: An unsuccessful telephone outreach was attempted today to offer the patient information about available complex care management services.  Follow Up Plan:  Additional outreach attempts will be made to offer the patient complex care management information and services.   Encounter Outcome:  No Answer  Harlene Satterfield  [facility]  [facility], [facility] Guide  Direct Dial: [phone]  Fax [phone]

## 2024-03-07 NOTE — Progress Notes (Signed)
 Complex Care Management Note  Care Guide Note 03/07/2024 Name: Madeline George MRN: [id number] DOB: 05/12/2001  [name] is a 23 y.o. year old female who sees Paseda, Folashade R, FNP for primary care. I reached out to [name] by phone today to offer complex care management services.  [name] was given information about Complex Care Management services today including:   The Complex Care Management services include support from the care team which includes your Nurse Care Manager, Clinical Social Worker, or Pharmacist.  The Complex Care Management team is here to help remove barriers to the health concerns and goals most important to you. Complex Care Management services are voluntary, and the patient may decline or stop services at any time by request to their care team member.   Complex Care Management Consent Status: Patient agreed to services and verbal consent obtained.   Follow up plan:  Telephone appointment with complex care management team member scheduled for:  03/29/24  Encounter Outcome:  Patient Scheduled  Harlene Satterfield  [facility]  [facility], [facility] Guide  Direct Dial: [phone]  Fax [phone]

## 2024-03-22 ENCOUNTER — Other Ambulatory Visit (HOSPITAL_COMMUNITY): Payer: Self-pay

## 2024-03-22 ENCOUNTER — Other Ambulatory Visit: Payer: Self-pay

## 2024-03-22 MED ORDER — NITROFURANTOIN MONOHYD MACRO 100 MG PO CAPS
100.0000 mg | ORAL_CAPSULE | Freq: Two times a day (BID) | ORAL | 0 refills | Status: AC
  Filled 2024-03-22 (×2): qty 14, 7d supply, fill #0

## 2024-03-29 ENCOUNTER — Telehealth: Admitting: Licensed Clinical Social Worker

## 2024-04-14 ENCOUNTER — Encounter: Payer: Self-pay | Admitting: Nurse Practitioner

## 2024-04-19 ENCOUNTER — Other Ambulatory Visit: Payer: Self-pay

## 2024-04-19 ENCOUNTER — Other Ambulatory Visit: Payer: Self-pay | Admitting: Nurse Practitioner

## 2024-04-19 ENCOUNTER — Telehealth: Payer: Self-pay

## 2024-04-19 DIAGNOSIS — F32A Depression, unspecified: Secondary | ICD-10-CM

## 2024-04-19 MED ORDER — FLUOXETINE HCL 20 MG PO CAPS
20.0000 mg | ORAL_CAPSULE | Freq: Every day | ORAL | 1 refills | Status: AC
Start: 2024-04-19 — End: 2025-04-19

## 2024-04-19 NOTE — Telephone Encounter (Signed)
 FLUoxetine  (PROZAC ) 20 MG capsule [[id number]]

## 2024-04-19 NOTE — Telephone Encounter (Signed)
 Please advise [facility]

## 2024-04-19 NOTE — Telephone Encounter (Signed)
 Sent to pcp [facility]

## 2024-05-01 ENCOUNTER — Telehealth: Payer: Self-pay | Admitting: Licensed Clinical Social Worker

## 2024-05-05 ENCOUNTER — Encounter: Payer: Self-pay | Admitting: Licensed Clinical Social Worker

## 2024-05-05 NOTE — Patient Instructions (Signed)
 [name] - I am sorry I was unable to reach you today for our scheduled appointment. I work with Paseda, Folashade R, FNP and am calling to support your healthcare needs. Please contact me at [phone] at your earliest convenience. I look forward to speaking with you soon.   Thank you,  Rolin Kerns, LCSW [place]  [facility], [facility] Clinical Social Worker Direct Dial: [phone]  Fax: [phone] Website: [email or web address] 4:54 PM

## 2024-05-09 ENCOUNTER — Telehealth: Payer: Self-pay

## 2024-05-09 NOTE — Progress Notes (Unsigned)
 Complex Care Management Care Guide Note  05/09/2024 Name: Madeline George MRN: [id number] DOB: 07-09-2000  [name] is a 23 y.o. year old female who is a primary care patient of Paseda, Folashade R, FNP and is actively engaged with the care management team. I reached out to [name] by phone today to assist with re-scheduling  with the Licensed Clinical Child Psychotherapist.  Follow up plan: Unsuccessful telephone outreach attempt made. A HIPAA compliant phone message was left for the patient providing contact information and requesting a return call.  Leotis Rase [facility], [facility] Guide  Direct Dial: [phone]  Fax [phone]

## 2024-05-12 NOTE — Progress Notes (Signed)
 Complex Care Management Care Guide Note  05/12/2024 Name: Madeline George MRN: [id number] DOB: 02-Mar-2001  [name] is a 23 y.o. year old female who is a primary care patient of Paseda, Folashade R, FNP and is actively engaged with the care management team. I reached out to [name] by phone today to assist with re-scheduling  with the Licensed Clinical Child Psychotherapist.  Follow up plan: Unsuccessful telephone outreach attempt made. A HIPAA compliant phone message was left for the patient providing contact information and requesting a return call.  Leotis Rase [facility], [facility] Guide  Direct Dial: [phone]  Fax [phone]

## 2024-05-17 ENCOUNTER — Telehealth: Payer: Self-pay

## 2024-05-17 NOTE — Progress Notes (Signed)
 Complex Care Management Care Guide Note  05/17/2024 Name: Madeline George MRN: [id number] DOB: 2001-04-15  Madeline George is a 23 y.o. year old female who is a primary care patient of Paseda, Folashade R, FNP and is actively engaged with the care management team. I reached out to Madeline George by phone today to assist with re-scheduling  with the Licensed Clinical Child Psychotherapist.  Follow up plan: Unsuccessful telephone outreach attempt made. A HIPAA compliant phone message was left for the patient providing contact information and requesting a return call.  Leotis Rase [facility], [facility] Guide  Direct Dial: [phone]  Fax [phone]

## 2024-05-18 NOTE — Progress Notes (Signed)
 Complex Care Management Care Guide Note  05/18/2024 Name: [name] MRN: [id number] DOB: 27-Feb-2001  Madeline George is a 23 y.o. year old female who is a primary care patient of Paseda, Folashade R, FNP and is actively engaged with the care management team. I reached out to Madeline George by phone today to assist with re-scheduling  with the Licensed Clinical Child Psychotherapist.  Follow up plan: Unsuccessful telephone outreach attempt made. A HIPAA compliant phone message was left for the patient providing contact information and requesting a return call.  Leotis Rase [facility], [facility] Guide  Direct Dial: [phone]  Fax [phone]

## 2024-06-11 ENCOUNTER — Other Ambulatory Visit (HOSPITAL_COMMUNITY): Payer: Self-pay

## 2024-06-27 ENCOUNTER — Encounter: Admitting: Nurse Practitioner

## 2024-06-27 ENCOUNTER — Other Ambulatory Visit (HOSPITAL_COMMUNITY): Payer: Self-pay

## 2024-07-01 ENCOUNTER — Other Ambulatory Visit (HOSPITAL_COMMUNITY): Payer: Self-pay

## 2024-07-06 ENCOUNTER — Other Ambulatory Visit: Payer: Self-pay

## 2024-07-06 ENCOUNTER — Emergency Department (HOSPITAL_BASED_OUTPATIENT_CLINIC_OR_DEPARTMENT_OTHER)
Admission: EM | Admit: 2024-07-06 | Discharge: 2024-07-07 | Disposition: A | Discharge status: Home / Self Care | Attending: Emergency Medicine | Admitting: Emergency Medicine

## 2024-07-06 ENCOUNTER — Encounter (HOSPITAL_BASED_OUTPATIENT_CLINIC_OR_DEPARTMENT_OTHER): Payer: Self-pay

## 2024-07-06 DIAGNOSIS — R1013 Epigastric pain: Secondary | ICD-10-CM | POA: Insufficient documentation

## 2024-07-06 DIAGNOSIS — R112 Nausea with vomiting, unspecified: Secondary | ICD-10-CM | POA: Diagnosis present

## 2024-07-06 DIAGNOSIS — R1116 Cannabis hyperemesis syndrome: Secondary | ICD-10-CM

## 2024-07-06 DIAGNOSIS — E860 Dehydration: Secondary | ICD-10-CM

## 2024-07-06 LAB — CBC
HCT: 36.9 % (ref 36.0–46.0)
Hemoglobin: 11.3 g/dL — ABNORMAL LOW (ref 12.0–15.0)
MCH: 22.3 pg — ABNORMAL LOW (ref 26.0–34.0)
MCHC: 30.6 g/dL (ref 30.0–36.0)
MCV: 72.8 fL — ABNORMAL LOW (ref 80.0–100.0)
Platelets: 460 10*3/uL — ABNORMAL HIGH (ref 150–400)
RBC: 5.07 MIL/uL (ref 3.87–5.11)
RDW: 15.9 % — ABNORMAL HIGH (ref 11.5–15.5)
WBC: 9.5 10*3/uL (ref 4.0–10.5)
nRBC: 0 % (ref 0.0–0.2)

## 2024-07-06 LAB — COMPREHENSIVE METABOLIC PANEL WITH GFR
ALT: 32 U/L (ref 0–44)
AST: 38 U/L (ref 15–41)
Albumin: 4 g/dL (ref 3.5–5.0)
Alkaline Phosphatase: 73 U/L (ref 38–126)
Anion gap: 21 — ABNORMAL HIGH (ref 5–15)
BUN: 6 mg/dL (ref 6–20)
CO2: 16 mmol/L — ABNORMAL LOW (ref 22–32)
Calcium: 9.2 mg/dL (ref 8.9–10.3)
Chloride: 99 mmol/L (ref 98–111)
Creatinine, Ser: 0.65 mg/dL (ref 0.44–1.00)
GFR, Estimated: >60 mL/min
Glucose, Bld: 102 mg/dL — ABNORMAL HIGH (ref 70–99)
Potassium: 3.6 mmol/L (ref 3.5–5.1)
Sodium: 136 mmol/L (ref 135–145)
Total Bilirubin: 0.4 mg/dL (ref 0.0–1.2)
Total Protein: 8.1 g/dL (ref 6.5–8.1)

## 2024-07-06 LAB — RESP PANEL BY RT-PCR (RSV, FLU A&B, COVID)  RVPGX2
Influenza A by PCR: NEGATIVE
Influenza B by PCR: NEGATIVE
Resp Syncytial Virus by PCR: NEGATIVE
SARS Coronavirus 2 by RT PCR: NEGATIVE

## 2024-07-06 LAB — URINALYSIS, ROUTINE W REFLEX MICROSCOPIC
Bacteria, UA: NONE SEEN
Bilirubin Urine: NEGATIVE
Glucose, UA: NEGATIVE mg/dL
Ketones, ur: >80 mg/dL — AB
Leukocytes,Ua: NEGATIVE
Nitrite: NEGATIVE
Specific Gravity, Urine: 1.022 (ref 1.005–1.030)
pH: 6 (ref 5.0–8.0)

## 2024-07-06 LAB — LIPASE, BLOOD: Lipase: 18 U/L (ref 11–51)

## 2024-07-06 LAB — PREGNANCY, URINE: Preg Test, Ur: NEGATIVE

## 2024-07-06 MED ORDER — LORAZEPAM 2 MG/ML IJ SOLN
1.0000 mg | Freq: Once | INTRAMUSCULAR | Status: AC
  Administered 2024-07-06: 1 mg via INTRAVENOUS
  Filled 2024-07-06: qty 1

## 2024-07-06 MED ORDER — METOCLOPRAMIDE HCL 5 MG/ML IJ SOLN
10.0000 mg | Freq: Once | INTRAMUSCULAR | Status: AC
  Administered 2024-07-06: 10 mg via INTRAVENOUS
  Filled 2024-07-06: qty 2

## 2024-07-06 MED ORDER — DEXTROSE 5 % IV BOLUS
1000.0000 mL | Freq: Once | INTRAVENOUS | Status: AC
  Administered 2024-07-06: 1000 mL via INTRAVENOUS

## 2024-07-06 MED ORDER — LACTATED RINGERS IV BOLUS
1000.0000 mL | Freq: Once | INTRAVENOUS | Status: AC
  Administered 2024-07-06: 1000 mL via INTRAVENOUS

## 2024-07-06 NOTE — Discharge Instructions (Addendum)
 You were very dehydrated today from all the vomiting.  You were given fluids today and medication for nausea.  Continue to stay hydrated and avoid alcohol and marijuana.

## 2024-07-06 NOTE — ED Triage Notes (Signed)
 Pt c/o another CHS episode. Last smoked approx 1-2wks ago. Vomiting daily since Friday. Reports congestion

## 2024-07-06 NOTE — ED Provider Notes (Signed)
 " [facility] EMERGENCY DEPARTMENT AT [facility] Provider Note   CSN: [id number] Arrival date & time: 07/06/24  8195     Patient presents with: Emesis and Abdominal Pain   Madeline George is a 24 y.o. female.   Patient is a 24 year old female with a history of prior cannabis hyperemesis syndrome, regular alcohol use and depression who is presenting today with complaints of nausea and vomiting.  She reports since Friday 7 days prior to arrival she has had intermittent nausea and vomiting but had been able to keep down some fluids until today when she has vomited all day and has not been able to hold anything down.  She reports her stomach is just achy all over mostly in the upper areas.  She has not had a fever and has not had diarrhea.  She is still making urine and denies any dysuria, frequency or urgency.  Menses have been normal and she is currently on her menses.  She reports similar symptoms in the past when she tried to take the Zofran but it has not helped her symptoms today.  She does report initially just drinking socially with friends but lately she has been drinking a little bit more regularly but denies any withdrawal symptoms.  The history is provided by the patient.  Emesis Associated symptoms: abdominal pain   Abdominal Pain Associated symptoms: vomiting        Prior to Admission medications  Medication Sig Start Date End Date Taking? Authorizing Provider  FLUoxetine  (PROZAC ) 20 MG capsule Take 1 capsule (20 mg total) by mouth daily. 04/19/24 04/19/25  Paseda, Folashade R, FNP  L-LYSINE HCL PO Take 1 tablet every day by oral route. 12/13/23   [provider]  Multiple Vitamin (MULTIVITAMIN ADULT PO)     [provider]  norethindrone -ethinyl estradiol -FE (JUNEL  FE 1/20) 1-20 MG-MCG tablet Take 1 tablet by mouth daily. 12/13/23     promethazine  (PHENERGAN ) 25 MG suppository Place 1 suppository (25 mg total) rectally every 6 (six) hours as needed for nausea  or vomiting. 02/24/24   Minnie Tinnie BRAVO, PA    Allergies: Patient has no known allergies.    Review of Systems  Gastrointestinal:  Positive for abdominal pain and vomiting.    Updated Vital Signs BP (!) 139/90   Pulse 72   Temp 98.2 F (36.8 C)   Resp 16   SpO2 100%   Physical Exam Vitals and nursing note reviewed.  Constitutional:      General: She is in acute distress.     Appearance: She is well-developed.  HENT:     Head: Normocephalic and atraumatic.     Mouth/Throat:     Mouth: Mucous membranes are dry.  Eyes:     Pupils: Pupils are equal, round, and reactive to light.  Cardiovascular:     Rate and Rhythm: Normal rate and regular rhythm.     Heart sounds: Normal heart sounds. No murmur heard.    No friction rub.  Pulmonary:     Effort: Pulmonary effort is normal.     Breath sounds: Normal breath sounds. No wheezing or rales.  Abdominal:     General: Bowel sounds are normal. There is no distension.     Palpations: Abdomen is soft.     Tenderness: There is abdominal tenderness. There is no guarding or rebound.     Comments: Mild epigastric tenderness  Musculoskeletal:        General: No tenderness. Normal range of motion.  Comments: No edema  Skin:    General: Skin is warm and dry.     Findings: No rash.  Neurological:     Mental Status: She is alert and oriented to person, place, and time. Mental status is at baseline.     Cranial Nerves: No cranial nerve deficit.  Psychiatric:        Behavior: Behavior normal.     (all labs ordered are listed, but only abnormal results are displayed) Labs Reviewed  COMPREHENSIVE METABOLIC PANEL WITH GFR - Abnormal; Notable for the following components:      Result Value   CO2 16 (*)    Glucose, Bld 102 (*)    Anion gap 21 (*)    All other components within normal limits  CBC - Abnormal; Notable for the following components:   Hemoglobin 11.3 (*)    MCV 72.8 (*)    MCH 22.3 (*)    RDW 15.9 (*)    Platelets 460  (*)    All other components within normal limits  RESP PANEL BY RT-PCR (RSV, FLU A&B, COVID)  RVPGX2  LIPASE, BLOOD  URINALYSIS, ROUTINE W REFLEX MICROSCOPIC  PREGNANCY, URINE    EKG: None  Radiology: No results found.   Procedures   Medications Ordered in the ED  lactated ringers  bolus 1,000 mL (has no administration in time range)  metoCLOPramide  (REGLAN ) injection 10 mg (has no administration in time range)                                    Medical Decision Making Amount and/or Complexity of Data Reviewed Labs: ordered. Decision-making details documented in ED Course.  Risk Prescription drug management.   Pt with multiple medical problems and comorbidities and presenting today with a complaint that caries a high risk for morbidity and mortality.  Here today with symptoms most classic for cannabis hyperemesis syndrome.  However also concern for AKI, electrolyte abnormalities, dehydration.  Lower suspicion for pancreatitis, cholecystitis.  No abdominal findings concerning for appendicitis.  Lower suspicion for acute urinary pathology or pregnancy. I independently interpreted patient's labs and lipase is within normal limits, CMP within normal limits except for a CO2 of 16 and an anion gap of 21, CBC without acute findings, viral panel is negative.  Urine pregnancy is negative and UA with greater than 80 ketones. Patient was started on IV fluids and antiemetics treat supportively to ensure patient is able to tolerate p.o.'s.      Final diagnoses:  None    ED Discharge Orders     None          Doretha Folks, MD 07/06/24 2322  "

## 2024-07-07 MED ORDER — DROPERIDOL 2.5 MG/ML IJ SOLN: 1.2500 mg | Freq: Once | INTRAMUSCULAR | Status: DC

## 2024-07-07 MED ORDER — ONDANSETRON 4 MG PO TBDP: 4.0000 mg | ORAL_TABLET | Freq: Three times a day (TID) | ORAL | 0 refills | Status: AC | PRN

## 2024-07-07 NOTE — ED Notes (Signed)
 Pt ambulatory to and from restroom, reports relief from vomiting after ativan  and fluids.

## 2024-07-07 NOTE — ED Provider Notes (Signed)
 On recheck, patient is resting comfortably.  Fluids have completed.  Requested that the patient trial p.o.  Per nursing she was able to drink without any further vomiting.  She has Zofran  at home.  Will plan for discharge home per Dr. Fonda discharge instructions.   Bari Charmaine FALCON, MD 07/07/24 [phone]

## 2024-07-10 ENCOUNTER — Ambulatory Visit: Admitting: Nurse Practitioner

## 2024-07-11 ENCOUNTER — Encounter: Payer: Self-pay | Admitting: Nurse Practitioner

## 2024-07-11 ENCOUNTER — Ambulatory Visit: Admitting: Nurse Practitioner

## 2024-07-11 VITALS — BP 151/100 | HR 78 | Temp 97.4°F | Wt 173.0 lb

## 2024-07-11 DIAGNOSIS — D649 Anemia, unspecified: Secondary | ICD-10-CM | POA: Insufficient documentation

## 2024-07-11 DIAGNOSIS — F330 Major depressive disorder, recurrent, mild: Secondary | ICD-10-CM

## 2024-07-11 DIAGNOSIS — Z09 Encounter for follow-up examination after completed treatment for conditions other than malignant neoplasm: Secondary | ICD-10-CM | POA: Insufficient documentation

## 2024-07-11 DIAGNOSIS — F419 Anxiety disorder, unspecified: Secondary | ICD-10-CM | POA: Diagnosis not present

## 2024-07-11 DIAGNOSIS — R030 Elevated blood-pressure reading, without diagnosis of hypertension: Secondary | ICD-10-CM | POA: Diagnosis not present

## 2024-07-11 DIAGNOSIS — R112 Nausea with vomiting, unspecified: Secondary | ICD-10-CM

## 2024-07-11 DIAGNOSIS — R0602 Shortness of breath: Secondary | ICD-10-CM | POA: Diagnosis not present

## 2024-07-11 DIAGNOSIS — R1013 Epigastric pain: Secondary | ICD-10-CM | POA: Diagnosis not present

## 2024-07-11 MED ORDER — FAMOTIDINE 20 MG PO TABS: 20.0000 mg | ORAL_TABLET | Freq: Two times a day (BID) | ORAL | 1 refills | Status: AC

## 2024-07-11 NOTE — Assessment & Plan Note (Signed)
 Lab Results  Component Value Date   WBC 9.5 07/06/2024   HGB 11.3 (L) 07/06/2024   HCT 36.9 07/06/2024   MCV 72.8 (L) 07/06/2024   PLT 460 (H) 07/06/2024   Hemoglobin level of 11.3 g/dL indicates mild anemia. Symptoms of dizziness and weakness may be related to anemia. - Ordered repeat blood count to assess current hemoglobin levels. - If CBC abnormal will check iron levels to evaluate for iron deficiency.

## 2024-07-11 NOTE — Assessment & Plan Note (Signed)
 Hospital chart reviewed, including discharge summary Medications reconciled and reviewed with the patient in detail

## 2024-07-11 NOTE — Patient Instructions (Signed)
 1. Anemia, unspecified type (Primary) - CBC  2. Dyspepsia - famotidine  (PEPCID ) 20 MG tablet; Take 1 tablet (20 mg total) by mouth 2 (two) times daily.  Dispense: 60 tablet; Refill: 1    It is important that you exercise regularly at least 30 minutes 5 times a week as tolerated  Think about what you will eat, plan ahead. Choose  clean, green, fresh or frozen over canned, processed or packaged foods which are more sugary, salty and fatty. 70 to 75% of food eaten should be vegetables and fruit. Three meals at set times with snacks allowed between meals, but they must be fruit or vegetables. Aim to eat over a 12 hour period , example 7 am to 7 pm, and STOP after  your last meal of the day. Drink water,generally about 64 ounces per day, no other drink is as healthy. Fruit juice is best enjoyed in a healthy way, by EATING the fruit.  Thanks for choosing [facility] we consider it a privelige to serve you.

## 2024-07-11 NOTE — Assessment & Plan Note (Signed)
 Checking CBC.

## 2024-07-11 NOTE — Assessment & Plan Note (Signed)
" °    07/11/2024    1:15 PM 03/03/2024    1:40 PM  GAD 7 : Generalized Anxiety Score  Nervous, Anxious, on Edge 0 2   Control/stop worrying 3 0   Worry too much - different things 1 2   Trouble relaxing 1 1   Restless 0 2   Easily annoyed or irritable 2 3   Afraid - awful might happen 2 2   Total GAD 7 Score 9 12  Anxiety Difficulty Somewhat difficult Somewhat difficult     Data saved with a previous flowsheet row definition    Anxiety and depression Managed with fluoxetine  20 mg daily. Symptoms may be exacerbated by recent health issues and lifestyle changes. - Continue fluoxetine  20 mg daily. "

## 2024-07-11 NOTE — Assessment & Plan Note (Signed)
" ° ° °    07/11/2024    1:19 PM 07/11/2024    1:15 PM 07/07/2024    2:45 AM 07/06/2024   10:45 PM 07/06/2024    6:40 PM 03/03/2024    1:35 PM 02/24/2024    7:00 PM  BP/Weight  Systolic BP 151 150 145 151 139 121 119  Diastolic BP 100 99 84 98 90 57 87  Wt. (Lbs)  173    156   BMI  26.3 kg/m2    23.72 kg/m2   Most likely high due to her abdominal pain Will monitor Follow-up in 4 weeks    "

## 2024-07-12 ENCOUNTER — Ambulatory Visit: Payer: Self-pay | Admitting: Nurse Practitioner

## 2024-07-12 DIAGNOSIS — D649 Anemia, unspecified: Secondary | ICD-10-CM

## 2024-07-12 LAB — CBC
Hematocrit: 40.7 % (ref 34.0–46.6)
Hemoglobin: 12.2 g/dL (ref 11.1–15.9)
MCH: 22.6 pg — ABNORMAL LOW (ref 26.6–33.0)
MCHC: 30 g/dL — ABNORMAL LOW (ref 31.5–35.7)
MCV: 75 fL — ABNORMAL LOW (ref 79–97)
Platelets: 459 10*3/uL — ABNORMAL HIGH (ref 150–450)
RBC: 5.41 x10E6/uL — ABNORMAL HIGH (ref 3.77–5.28)
RDW: 17.2 % — ABNORMAL HIGH (ref 11.7–15.4)
WBC: 7.6 10*3/uL (ref 3.4–10.8)

## 2024-07-13 ENCOUNTER — Telehealth: Payer: Self-pay

## 2024-07-13 NOTE — Telephone Encounter (Signed)
 Copied from CRM [id number]. Topic: General - Other >> Jul 13, 2024 10:42 AM [place] MATSU wrote: Reason for CRM: Patient called back to speak with Suzen Shove RMA. However no contact # left.   I reached out to [facility] no answer.   May you please assist.   Pt was already seen. Kh

## 2024-07-13 NOTE — Transitions of Care (Post Inpatient/ED Visit) (Signed)
" ° °  07/13/2024  Name: MATY ZEISLER MRN: [id number] DOB: Feb 15, 2001  Today's TOC FU Call Status: Today's TOC FU Call Status:: Unsuccessful Call (1st Attempt) Unsuccessful Call (1st Attempt) Date: 07/13/24  Attempted to reach the patient regarding the most recent Inpatient/ED visit.  Follow Up Plan: Additional outreach attempts will be made to reach the patient to complete the Transitions of Care (Post Inpatient/ED visit) call.   Signature Suzen Shove   CMA II "

## 2024-07-18 ENCOUNTER — Encounter

## 2024-07-19 ENCOUNTER — Ambulatory Visit: Payer: Self-pay | Admitting: Allergy

## 2024-08-30 ENCOUNTER — Encounter: Payer: Self-pay | Admitting: Nurse Practitioner

## 2024-09-29 ENCOUNTER — Encounter: Admitting: Nurse Practitioner

## 2024-10-03 ENCOUNTER — Encounter
